# Patient Record
Sex: Female | Born: 1980 | Hispanic: Yes | Marital: Single | Smoking: Never smoker
Health system: Southern US, Community
[De-identification: ages and names within clinical notes are randomized; demographics above are authoritative.]

## PROBLEM LIST (undated history)

## (undated) HISTORY — PX: CHOLECYSTECTOMY: SHX55

---

## 2017-09-04 ENCOUNTER — Emergency Department (HOSPITAL_COMMUNITY): Admission: EM | Admit: 2017-09-04 | Discharge: 2017-09-05 | Discharge status: Against Medical Advice | Payer: Self-pay

## 2017-09-04 NOTE — ED Notes (Signed)
Called multiple times for triage with no answer 

## 2017-09-05 ENCOUNTER — Emergency Department (HOSPITAL_COMMUNITY)
Admission: EM | Admit: 2017-09-05 | Discharge: 2017-09-05 | Disposition: A | Discharge status: Home / Self Care | Payer: Self-pay | Attending: Emergency Medicine | Admitting: Emergency Medicine

## 2017-09-05 ENCOUNTER — Encounter (HOSPITAL_COMMUNITY): Payer: Self-pay | Admitting: Emergency Medicine

## 2017-09-05 ENCOUNTER — Emergency Department (HOSPITAL_COMMUNITY): Payer: Self-pay

## 2017-09-05 DIAGNOSIS — R200 Anesthesia of skin: Secondary | ICD-10-CM | POA: Insufficient documentation

## 2017-09-05 DIAGNOSIS — R51 Headache: Secondary | ICD-10-CM | POA: Insufficient documentation

## 2017-09-05 DIAGNOSIS — R519 Headache, unspecified: Secondary | ICD-10-CM

## 2017-09-05 DIAGNOSIS — R42 Dizziness and giddiness: Secondary | ICD-10-CM | POA: Insufficient documentation

## 2017-09-05 DIAGNOSIS — R202 Paresthesia of skin: Secondary | ICD-10-CM | POA: Insufficient documentation

## 2017-09-05 LAB — COMPREHENSIVE METABOLIC PANEL
ALT: 25 U/L (ref 14–54)
AST: 23 U/L (ref 15–41)
Albumin: 4.4 g/dL (ref 3.5–5.0)
Alkaline Phosphatase: 65 U/L (ref 38–126)
Anion gap: 12 (ref 5–15)
BUN: 8 mg/dL (ref 6–20)
CO2: 23 mmol/L (ref 22–32)
Calcium: 9.6 mg/dL (ref 8.9–10.3)
Chloride: 102 mmol/L (ref 101–111)
Creatinine, Ser: 0.69 mg/dL (ref 0.44–1.00)
GFR calc Af Amer: >60 mL/min (ref >60)
GFR calc non Af Amer: >60 mL/min (ref >60)
Glucose, Bld: 114 mg/dL — ABNORMAL HIGH (ref 65–99)
Potassium: 4.1 mmol/L (ref 3.5–5.1)
Sodium: 137 mmol/L (ref 135–145)
Total Bilirubin: 0.5 mg/dL (ref 0.3–1.2)
Total Protein: 7.6 g/dL (ref 6.5–8.1)

## 2017-09-05 LAB — CBC
HCT: 42.3 % (ref 36.0–46.0)
Hemoglobin: 14.1 g/dL (ref 12.0–15.0)
MCH: 29.3 pg (ref 26.0–34.0)
MCHC: 33.3 g/dL (ref 30.0–36.0)
MCV: 87.8 fL (ref 78.0–100.0)
Platelets: 363 10*3/uL (ref 150–400)
RBC: 4.82 MIL/uL (ref 3.87–5.11)
RDW: 13.6 % (ref 11.5–15.5)
WBC: 8.4 10*3/uL (ref 4.0–10.5)

## 2017-09-05 LAB — I-STAT CHEM 8, ED
BUN: 7 mg/dL (ref 6–20)
Calcium, Ion: 1.15 mmol/L (ref 1.15–1.40)
Chloride: 103 mmol/L (ref 101–111)
Creatinine, Ser: 0.7 mg/dL (ref 0.44–1.00)
Glucose, Bld: 114 mg/dL — ABNORMAL HIGH (ref 65–99)
HCT: 43 % (ref 36.0–46.0)
Hemoglobin: 14.6 g/dL (ref 12.0–15.0)
Potassium: 4 mmol/L (ref 3.5–5.1)
Sodium: 140 mmol/L (ref 135–145)
TCO2: 25 mmol/L (ref 22–32)

## 2017-09-05 LAB — DIFFERENTIAL
Basophils Absolute: 0 10*3/uL (ref 0.0–0.1)
Basophils Relative: 0 %
Eosinophils Absolute: 0.1 10*3/uL (ref 0.0–0.7)
Eosinophils Relative: 1 %
Lymphocytes Relative: 48 %
Lymphs Abs: 4.1 10*3/uL — ABNORMAL HIGH (ref 0.7–4.0)
Monocytes Absolute: 0.7 10*3/uL (ref 0.1–1.0)
Monocytes Relative: 9 %
Neutro Abs: 3.5 10*3/uL (ref 1.7–7.7)
Neutrophils Relative %: 42 %

## 2017-09-05 LAB — I-STAT TROPONIN, ED: Troponin i, poc: 0 ng/mL (ref 0.00–0.08)

## 2017-09-05 LAB — APTT: aPTT: 31 seconds (ref 24–36)

## 2017-09-05 LAB — PROTIME-INR
INR: 1.02
Prothrombin Time: 13.3 seconds (ref 11.4–15.2)

## 2017-09-05 LAB — I-STAT BETA HCG BLOOD, ED (MC, WL, AP ONLY): I-stat hCG, quantitative: <5 m[IU]/mL (ref <5)

## 2017-09-05 MED ORDER — GADOBENATE DIMEGLUMINE 529 MG/ML IV SOLN
15.0000 mL | Freq: Once | INTRAVENOUS | Status: AC | PRN
  Administered 2017-09-05: 15 mL via INTRAVENOUS

## 2017-09-05 MED ORDER — PROCHLORPERAZINE MALEATE 10 MG PO TABS: 10.0000 mg | ORAL_TABLET | Freq: Two times a day (BID) | ORAL | 0 refills | Status: AC | PRN

## 2017-09-05 NOTE — ED Notes (Signed)
Declined W/C at D/C and was escorted to lobby by RN. 

## 2017-09-05 NOTE — ED Triage Notes (Signed)
EDP at bed side to report results of MRI

## 2017-09-05 NOTE — ED Provider Notes (Signed)
[facility] EMERGENCY DEPARTMENT Provider Note   CSN: [id number] Arrival date & time: 09/05/17  0304     History   Chief Complaint Chief Complaint  Patient presents with  . Numbness    HPI Brandi Nicholson is a 37 y.o. female.  The history is provided by the patient and medical records. The history is limited by a language barrier. A language interpreter was used.  Neurologic Problem  This is a new problem. The current episode started more than 2 days ago. The problem occurs constantly. The problem has not changed since onset.Pertinent negatives include no chest pain, no abdominal pain, no headaches and no shortness of breath. Nothing aggravates the symptoms. Nothing relieves the symptoms. She has tried nothing for the symptoms. The treatment provided no relief.    History reviewed. No pertinent past medical history.  There are no active problems to display for this patient.   Past Surgical History:  Procedure Laterality Date  . CHOLECYSTECTOMY       OB History   None      Home Medications    Prior to Admission medications   Not on File    Family History No family history on file.  Social History Social History   Tobacco Use  . Smoking status: Not on file  . Smokeless tobacco: Never Used  Substance Use Topics  . Alcohol use: Yes    Comment: Socially   . Drug use: Never     Allergies   Patient has no known allergies.   Review of Systems Review of Systems  Constitutional: Negative for chills, diaphoresis, fatigue and fever.  HENT: Negative for congestion.   Eyes: Positive for visual disturbance.  Respiratory: Negative for cough, chest tightness, shortness of breath and stridor.   Cardiovascular: Negative for chest pain, palpitations and leg swelling.  Gastrointestinal: Negative for abdominal pain, constipation, diarrhea, nausea and vomiting.  Genitourinary: Negative for flank pain.  Musculoskeletal: Negative for back pain, neck  pain and neck stiffness.  Neurological: Positive for dizziness, weakness (subjective) and numbness. Negative for seizures, speech difficulty, light-headedness and headaches.  Psychiatric/Behavioral: Negative for agitation.  All other systems reviewed and are negative.    Physical Exam Updated Vital Signs BP 125/82 (BP Location: Left Arm)   Pulse (!) 53   Temp 98 F (36.7 C) (Oral)   Resp 16   Ht 5\' 3"  (1.6 m)   Wt 72.6 kg (160 lb)   SpO2 100%   BMI 28.34 kg/m   Physical Exam  Constitutional: She is oriented to person, place, and time. She appears well-developed and well-nourished. No distress.  HENT:  Head: Normocephalic and atraumatic.  Mouth/Throat: Oropharynx is clear and moist. No oropharyngeal exudate.  Eyes: Pupils are equal, round, and reactive to light. Conjunctivae and EOM are normal.  Neck: Normal range of motion. Neck supple.  Cardiovascular: Normal rate and intact distal pulses.  No murmur heard. Pulmonary/Chest: Effort normal and breath sounds normal. No respiratory distress. She has no wheezes. She exhibits no tenderness.  Abdominal: Bowel sounds are normal. There is no tenderness.  Musculoskeletal: She exhibits no edema or tenderness.  Neurological: She is alert and oriented to person, place, and time. She is not disoriented. She displays no tremor. A sensory deficit is present. No cranial nerve deficit. She exhibits normal muscle tone. Coordination normal.  Normal finger-nose-finger testing.  Patient felt dizzy and gait testing was deferred at this time.  No weakness appreciated however she has right-sided numbness in her  right arm and right sole of the foot.  Skin: Skin is warm. Capillary refill takes less than 2 seconds. No rash noted. She is not diaphoretic. No erythema.  Psychiatric: She has a normal mood and affect.  Nursing note and vitals reviewed.    ED Treatments / Results  Labs (all labs ordered are listed, but only abnormal results are  displayed) Labs Reviewed  DIFFERENTIAL - Abnormal; Notable for the following components:      Result Value   Lymphs Abs 4.1 (*)    All other components within normal limits  COMPREHENSIVE METABOLIC PANEL - Abnormal; Notable for the following components:   Glucose, Bld 114 (*)    All other components within normal limits  I-STAT CHEM 8, ED - Abnormal; Notable for the following components:   Glucose, Bld 114 (*)    All other components within normal limits  PROTIME-INR  APTT  CBC  I-STAT TROPONIN, ED  I-STAT BETA HCG BLOOD, ED ([facility], [facility], [facility] ONLY)    EKG None  Radiology Ct Head Wo Contrast  Result Date: 09/05/2017 CLINICAL DATA:  Right arm numbness and blurry vision for 4 days. Sensory deficits in the right arm. EXAM: CT HEAD WITHOUT CONTRAST TECHNIQUE: Contiguous axial images were obtained from the base of the skull through the vertex without intravenous contrast. COMPARISON:  None. FINDINGS: Brain: No evidence of acute infarction, hemorrhage, hydrocephalus, extra-axial collection or mass lesion/mass effect. Vascular: No hyperdense vessel or unexpected calcification. Skull: Normal. Negative for fracture or focal lesion. Sinuses/Orbits: No acute finding. Other: None. IMPRESSION: No acute intracranial abnormalities. Electronically Signed   By: Burman Nieves M.D.   On: 09/05/2017 04:44   Mr Angiogram Head Wo Contrast  Result Date: 09/05/2017 CLINICAL DATA:  37 year old female with blurred vision and right arm numbness onset 4 days ago. EXAM: MRI HEAD WITHOUT AND WITH CONTRAST MRI CERVICAL SPINE WITHOUT AND WITH CONTRAST MRA HEAD WITHOUT CONTRAST MRA NECK WITHOUT AND WITH CONTRAST TECHNIQUE: Multiplanar, multiecho pulse sequences of the brain and cervical spine were obtained without and with intravenous contrast. Angiographic images of the Circle of Willis were obtained using MRA technique without intravenous contrast. Angiographic images of the neck were obtained using MRA technique without  and with intravenous contrast. Carotid stenosis measurements (when applicable) are obtained utilizing NASCET criteria, using the distal internal carotid diameter as the denominator. CONTRAST:  15mL MULTIHANCE GADOBENATE DIMEGLUMINE 529 MG/ML IV SOLN COMPARISON:  Head CT without contrast 0341 hours today. FINDINGS: MRI HEAD FINDINGS Brain: Cerebral volume is within normal limits. No restricted diffusion to suggest acute infarction. No midline shift, mass effect, evidence of mass lesion, ventriculomegaly, extra-axial collection or acute intracranial hemorrhage. Cervicomedullary junction and pituitary are within normal limits. Wallace Cullens and white matter signal is within normal limits throughout the brain. No encephalomalacia or chronic cerebral blood products. No abnormal enhancement identified. No dural thickening. Vascular: Major intracranial vascular flow voids are preserved. The major dural venous sinuses are enhancing and appear to be patent. Skull and upper cervical spine: Cervical spine findings are below. Visualized bone marrow signal is within normal limits. Sinuses/Orbits: Normal orbits soft tissues. Paranasal sinuses are clear aside from mild right maxillary alveolar recess mucosal thickening. Other: Visible internal auditory structures appear normal. Mastoids are clear. Scalp and face soft tissues appear negative. MRA NECK FINDINGS Precontrast time-of-flight images demonstrate antegrade flow in both cervical carotid and vertebral arteries to the skull base. The left vertebral artery is mildly dominant. The carotid bifurcations appear normal. Suboptimal arterial contrast bolus  timing in the neck. However, adequate arterial detail is present on the MRA source images. Three vessel arch configuration. Normal great vessel origins. The right CCA, right carotid bifurcation, and cervical right ICA appear normal. The left CCA, left carotid bifurcation, and cervical left ICA appear normal. Both proximal subclavian  arteries appear normal. Both vertebral artery origins are normal. The left vertebral artery is dominant. No vertebral artery stenosis or abnormality is evident in the neck. MRA HEAD FINDINGS Antegrade flow in the posterior circulation with mildly dominant distal left vertebral artery. Normal left PICA origin. The right AICA appears dominant and is patent. Patent vertebrobasilar junction and basilar artery without stenosis. Normal SCA and right PCA origins. Fetal type left PCA origin. The right posterior communicating artery is present. Bilateral PCA branches appear normal. Antegrade flow in both ICA siphons. No siphon stenosis. Normal ophthalmic and posterior communicating artery origins. Patent carotid termini. Normal MCA and ACA origins. The anterior communicating artery is within normal limits. Visible bilateral ACA branches are within normal limits. The MCA M1 segments appear symmetric and within normal limits. The MCA bi/trifurcations and visible bilateral MCA branches appear symmetric and within normal limits. MRI CERVICAL SPINE FINDINGS Alignment: Mild straightening of cervical lordosis. No spondylolisthesis. Vertebrae: No marrow edema or evidence of acute osseous abnormality. Visualized bone marrow signal is within normal limits. Cord: Spinal cord signal is within normal limits at all visualized levels. No abnormal intradural enhancement. No dural thickening. Paraspinal and other soft tissues: Negative paraspinal and visible neck soft tissues. The major vascular flow voids in the neck are preserved. Negative visible lung apices and superior mediastinum. Disc levels: C2-C3:  Negative. C3-C4: Mild broad-based posterior disc bulge or protrusion with mild endplate spurring (series 15, image 7). Narrowing of the ventral CSF space. Borderline to mild spinal stenosis. No spinal cord mass effect or signal abnormality. No convincing foraminal involvement. C4-C5:  Negative. C5-C6: Small right paracentral disc  protrusion (series 21, image 16). No stenosis. C6-C7: Subtle central disc protrusion or annular fissure (series 21, image 21). No stenosis. C7-T1:  Negative. The visible upper thoracic levels appear negative. IMPRESSION: 1.  Normal MRI appearance of the brain. 2. No arterial abnormality on MRA Head and Neck. 3. Normal cervical and visible upper thoracic spinal cord. 4. Mild cervical disc degeneration at C3-C4, C5-C6, and C6-C7. There is subsequent minimal to mild spinal stenosis at C3-C4, but no other convincing neural impingement. Electronically Signed   By: Odessa Fleming M.D.   On: 09/05/2017 15:17   Mr Angiogram Neck W Or Wo Contrast  Result Date: 09/05/2017 CLINICAL DATA:  37 year old female with blurred vision and right arm numbness onset 4 days ago. EXAM: MRI HEAD WITHOUT AND WITH CONTRAST MRI CERVICAL SPINE WITHOUT AND WITH CONTRAST MRA HEAD WITHOUT CONTRAST MRA NECK WITHOUT AND WITH CONTRAST TECHNIQUE: Multiplanar, multiecho pulse sequences of the brain and cervical spine were obtained without and with intravenous contrast. Angiographic images of the Circle of Willis were obtained using MRA technique without intravenous contrast. Angiographic images of the neck were obtained using MRA technique without and with intravenous contrast. Carotid stenosis measurements (when applicable) are obtained utilizing NASCET criteria, using the distal internal carotid diameter as the denominator. CONTRAST:  15mL MULTIHANCE GADOBENATE DIMEGLUMINE 529 MG/ML IV SOLN COMPARISON:  Head CT without contrast 0341 hours today. FINDINGS: MRI HEAD FINDINGS Brain: Cerebral volume is within normal limits. No restricted diffusion to suggest acute infarction. No midline shift, mass effect, evidence of mass lesion, ventriculomegaly, extra-axial collection or acute  intracranial hemorrhage. Cervicomedullary junction and pituitary are within normal limits. Wallace Cullens and white matter signal is within normal limits throughout the brain. No  encephalomalacia or chronic cerebral blood products. No abnormal enhancement identified. No dural thickening. Vascular: Major intracranial vascular flow voids are preserved. The major dural venous sinuses are enhancing and appear to be patent. Skull and upper cervical spine: Cervical spine findings are below. Visualized bone marrow signal is within normal limits. Sinuses/Orbits: Normal orbits soft tissues. Paranasal sinuses are clear aside from mild right maxillary alveolar recess mucosal thickening. Other: Visible internal auditory structures appear normal. Mastoids are clear. Scalp and face soft tissues appear negative. MRA NECK FINDINGS Precontrast time-of-flight images demonstrate antegrade flow in both cervical carotid and vertebral arteries to the skull base. The left vertebral artery is mildly dominant. The carotid bifurcations appear normal. Suboptimal arterial contrast bolus timing in the neck. However, adequate arterial detail is present on the MRA source images. Three vessel arch configuration. Normal great vessel origins. The right CCA, right carotid bifurcation, and cervical right ICA appear normal. The left CCA, left carotid bifurcation, and cervical left ICA appear normal. Both proximal subclavian arteries appear normal. Both vertebral artery origins are normal. The left vertebral artery is dominant. No vertebral artery stenosis or abnormality is evident in the neck. MRA HEAD FINDINGS Antegrade flow in the posterior circulation with mildly dominant distal left vertebral artery. Normal left PICA origin. The right AICA appears dominant and is patent. Patent vertebrobasilar junction and basilar artery without stenosis. Normal SCA and right PCA origins. Fetal type left PCA origin. The right posterior communicating artery is present. Bilateral PCA branches appear normal. Antegrade flow in both ICA siphons. No siphon stenosis. Normal ophthalmic and posterior communicating artery origins. Patent carotid  termini. Normal MCA and ACA origins. The anterior communicating artery is within normal limits. Visible bilateral ACA branches are within normal limits. The MCA M1 segments appear symmetric and within normal limits. The MCA bi/trifurcations and visible bilateral MCA branches appear symmetric and within normal limits. MRI CERVICAL SPINE FINDINGS Alignment: Mild straightening of cervical lordosis. No spondylolisthesis. Vertebrae: No marrow edema or evidence of acute osseous abnormality. Visualized bone marrow signal is within normal limits. Cord: Spinal cord signal is within normal limits at all visualized levels. No abnormal intradural enhancement. No dural thickening. Paraspinal and other soft tissues: Negative paraspinal and visible neck soft tissues. The major vascular flow voids in the neck are preserved. Negative visible lung apices and superior mediastinum. Disc levels: C2-C3:  Negative. C3-C4: Mild broad-based posterior disc bulge or protrusion with mild endplate spurring (series 15, image 7). Narrowing of the ventral CSF space. Borderline to mild spinal stenosis. No spinal cord mass effect or signal abnormality. No convincing foraminal involvement. C4-C5:  Negative. C5-C6: Small right paracentral disc protrusion (series 21, image 16). No stenosis. C6-C7: Subtle central disc protrusion or annular fissure (series 21, image 21). No stenosis. C7-T1:  Negative. The visible upper thoracic levels appear negative. IMPRESSION: 1.  Normal MRI appearance of the brain. 2. No arterial abnormality on MRA Head and Neck. 3. Normal cervical and visible upper thoracic spinal cord. 4. Mild cervical disc degeneration at C3-C4, C5-C6, and C6-C7. There is subsequent minimal to mild spinal stenosis at C3-C4, but no other convincing neural impingement. Electronically Signed   By: Odessa Fleming M.D.   On: 09/05/2017 15:17   Mr Laqueta Jean And Wo Contrast  Result Date: 09/05/2017 CLINICAL DATA:  37 year old female with blurred vision and  right arm numbness onset  4 days ago. EXAM: MRI HEAD WITHOUT AND WITH CONTRAST MRI CERVICAL SPINE WITHOUT AND WITH CONTRAST MRA HEAD WITHOUT CONTRAST MRA NECK WITHOUT AND WITH CONTRAST TECHNIQUE: Multiplanar, multiecho pulse sequences of the brain and cervical spine were obtained without and with intravenous contrast. Angiographic images of the Circle of Willis were obtained using MRA technique without intravenous contrast. Angiographic images of the neck were obtained using MRA technique without and with intravenous contrast. Carotid stenosis measurements (when applicable) are obtained utilizing NASCET criteria, using the distal internal carotid diameter as the denominator. CONTRAST:  15mL MULTIHANCE GADOBENATE DIMEGLUMINE 529 MG/ML IV SOLN COMPARISON:  Head CT without contrast 0341 hours today. FINDINGS: MRI HEAD FINDINGS Brain: Cerebral volume is within normal limits. No restricted diffusion to suggest acute infarction. No midline shift, mass effect, evidence of mass lesion, ventriculomegaly, extra-axial collection or acute intracranial hemorrhage. Cervicomedullary junction and pituitary are within normal limits. Wallace Cullens and white matter signal is within normal limits throughout the brain. No encephalomalacia or chronic cerebral blood products. No abnormal enhancement identified. No dural thickening. Vascular: Major intracranial vascular flow voids are preserved. The major dural venous sinuses are enhancing and appear to be patent. Skull and upper cervical spine: Cervical spine findings are below. Visualized bone marrow signal is within normal limits. Sinuses/Orbits: Normal orbits soft tissues. Paranasal sinuses are clear aside from mild right maxillary alveolar recess mucosal thickening. Other: Visible internal auditory structures appear normal. Mastoids are clear. Scalp and face soft tissues appear negative. MRA NECK FINDINGS Precontrast time-of-flight images demonstrate antegrade flow in both cervical carotid and  vertebral arteries to the skull base. The left vertebral artery is mildly dominant. The carotid bifurcations appear normal. Suboptimal arterial contrast bolus timing in the neck. However, adequate arterial detail is present on the MRA source images. Three vessel arch configuration. Normal great vessel origins. The right CCA, right carotid bifurcation, and cervical right ICA appear normal. The left CCA, left carotid bifurcation, and cervical left ICA appear normal. Both proximal subclavian arteries appear normal. Both vertebral artery origins are normal. The left vertebral artery is dominant. No vertebral artery stenosis or abnormality is evident in the neck. MRA HEAD FINDINGS Antegrade flow in the posterior circulation with mildly dominant distal left vertebral artery. Normal left PICA origin. The right AICA appears dominant and is patent. Patent vertebrobasilar junction and basilar artery without stenosis. Normal SCA and right PCA origins. Fetal type left PCA origin. The right posterior communicating artery is present. Bilateral PCA branches appear normal. Antegrade flow in both ICA siphons. No siphon stenosis. Normal ophthalmic and posterior communicating artery origins. Patent carotid termini. Normal MCA and ACA origins. The anterior communicating artery is within normal limits. Visible bilateral ACA branches are within normal limits. The MCA M1 segments appear symmetric and within normal limits. The MCA bi/trifurcations and visible bilateral MCA branches appear symmetric and within normal limits. MRI CERVICAL SPINE FINDINGS Alignment: Mild straightening of cervical lordosis. No spondylolisthesis. Vertebrae: No marrow edema or evidence of acute osseous abnormality. Visualized bone marrow signal is within normal limits. Cord: Spinal cord signal is within normal limits at all visualized levels. No abnormal intradural enhancement. No dural thickening. Paraspinal and other soft tissues: Negative paraspinal and  visible neck soft tissues. The major vascular flow voids in the neck are preserved. Negative visible lung apices and superior mediastinum. Disc levels: C2-C3:  Negative. C3-C4: Mild broad-based posterior disc bulge or protrusion with mild endplate spurring (series 15, image 7). Narrowing of the ventral CSF space. Borderline to mild spinal  stenosis. No spinal cord mass effect or signal abnormality. No convincing foraminal involvement. C4-C5:  Negative. C5-C6: Small right paracentral disc protrusion (series 21, image 16). No stenosis. C6-C7: Subtle central disc protrusion or annular fissure (series 21, image 21). No stenosis. C7-T1:  Negative. The visible upper thoracic levels appear negative. IMPRESSION: 1.  Normal MRI appearance of the brain. 2. No arterial abnormality on MRA Head and Neck. 3. Normal cervical and visible upper thoracic spinal cord. 4. Mild cervical disc degeneration at C3-C4, C5-C6, and C6-C7. There is subsequent minimal to mild spinal stenosis at C3-C4, but no other convincing neural impingement. Electronically Signed   By: Odessa FlemingH  Hall M.D.   On: 09/05/2017 15:17   Mr Cervical Spine W Or Wo Contrast  Result Date: 09/05/2017 CLINICAL DATA:  37 year old female with blurred vision and right arm numbness onset 4 days ago. EXAM: MRI HEAD WITHOUT AND WITH CONTRAST MRI CERVICAL SPINE WITHOUT AND WITH CONTRAST MRA HEAD WITHOUT CONTRAST MRA NECK WITHOUT AND WITH CONTRAST TECHNIQUE: Multiplanar, multiecho pulse sequences of the brain and cervical spine were obtained without and with intravenous contrast. Angiographic images of the Circle of Willis were obtained using MRA technique without intravenous contrast. Angiographic images of the neck were obtained using MRA technique without and with intravenous contrast. Carotid stenosis measurements (when applicable) are obtained utilizing NASCET criteria, using the distal internal carotid diameter as the denominator. CONTRAST:  15mL MULTIHANCE GADOBENATE  DIMEGLUMINE 529 MG/ML IV SOLN COMPARISON:  Head CT without contrast 0341 hours today. FINDINGS: MRI HEAD FINDINGS Brain: Cerebral volume is within normal limits. No restricted diffusion to suggest acute infarction. No midline shift, mass effect, evidence of mass lesion, ventriculomegaly, extra-axial collection or acute intracranial hemorrhage. Cervicomedullary junction and pituitary are within normal limits. Wallace CullensGray and white matter signal is within normal limits throughout the brain. No encephalomalacia or chronic cerebral blood products. No abnormal enhancement identified. No dural thickening. Vascular: Major intracranial vascular flow voids are preserved. The major dural venous sinuses are enhancing and appear to be patent. Skull and upper cervical spine: Cervical spine findings are below. Visualized bone marrow signal is within normal limits. Sinuses/Orbits: Normal orbits soft tissues. Paranasal sinuses are clear aside from mild right maxillary alveolar recess mucosal thickening. Other: Visible internal auditory structures appear normal. Mastoids are clear. Scalp and face soft tissues appear negative. MRA NECK FINDINGS Precontrast time-of-flight images demonstrate antegrade flow in both cervical carotid and vertebral arteries to the skull base. The left vertebral artery is mildly dominant. The carotid bifurcations appear normal. Suboptimal arterial contrast bolus timing in the neck. However, adequate arterial detail is present on the MRA source images. Three vessel arch configuration. Normal great vessel origins. The right CCA, right carotid bifurcation, and cervical right ICA appear normal. The left CCA, left carotid bifurcation, and cervical left ICA appear normal. Both proximal subclavian arteries appear normal. Both vertebral artery origins are normal. The left vertebral artery is dominant. No vertebral artery stenosis or abnormality is evident in the neck. MRA HEAD FINDINGS Antegrade flow in the posterior  circulation with mildly dominant distal left vertebral artery. Normal left PICA origin. The right AICA appears dominant and is patent. Patent vertebrobasilar junction and basilar artery without stenosis. Normal SCA and right PCA origins. Fetal type left PCA origin. The right posterior communicating artery is present. Bilateral PCA branches appear normal. Antegrade flow in both ICA siphons. No siphon stenosis. Normal ophthalmic and posterior communicating artery origins. Patent carotid termini. Normal MCA and ACA origins. The anterior communicating artery  is within normal limits. Visible bilateral ACA branches are within normal limits. The MCA M1 segments appear symmetric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

## 2017-09-05 NOTE — Discharge Instructions (Signed)
Your workup today did not show evidence of stroke.  We suspect your numbness and vision changes are secondary to your headaches.  Please use the medicine to help with your headache and please follow-up with neurology for further evaluation and management.  If any symptoms change or worsen, please return to the nearest emergency department.

## 2017-09-05 NOTE — ED Triage Notes (Signed)
Pt reports R arm numbness and blurry vision onset 4 days ago. Pt noted to have sensory deficits in R arm, no other neuro deficits. VAN negative.

## 2017-09-05 NOTE — ED Notes (Signed)
Patient continue to be in MRI

## 2017-09-05 NOTE — ED Notes (Signed)
Patient still in MRI.  

## 2017-09-05 NOTE — ED Notes (Signed)
Patient transported to MRI 

## 2017-10-15 ENCOUNTER — Emergency Department (HOSPITAL_COMMUNITY)
Admission: EM | Admit: 2017-10-15 | Discharge: 2017-10-15 | Disposition: A | Discharge status: Home / Self Care | Payer: Self-pay | Attending: Emergency Medicine | Admitting: Emergency Medicine

## 2017-10-15 ENCOUNTER — Encounter (HOSPITAL_COMMUNITY): Payer: Self-pay | Admitting: Emergency Medicine

## 2017-10-15 ENCOUNTER — Other Ambulatory Visit: Payer: Self-pay

## 2017-10-15 DIAGNOSIS — N390 Urinary tract infection, site not specified: Secondary | ICD-10-CM | POA: Insufficient documentation

## 2017-10-15 DIAGNOSIS — M5431 Sciatica, right side: Secondary | ICD-10-CM | POA: Insufficient documentation

## 2017-10-15 LAB — URINALYSIS, ROUTINE W REFLEX MICROSCOPIC
Bilirubin Urine: NEGATIVE
Bilirubin Urine: NEGATIVE
Glucose, UA: NEGATIVE mg/dL
Glucose, UA: NEGATIVE mg/dL
Hgb urine dipstick: NEGATIVE
Hgb urine dipstick: NEGATIVE
Ketones, ur: NEGATIVE mg/dL
Ketones, ur: NEGATIVE mg/dL
Nitrite: NEGATIVE
Nitrite: NEGATIVE
Protein, ur: NEGATIVE mg/dL
Protein, ur: NEGATIVE mg/dL
Specific Gravity, Urine: 1.021 (ref 1.005–1.030)
Specific Gravity, Urine: 1.021 (ref 1.005–1.030)
pH: 7 (ref 5.0–8.0)
pH: 7 (ref 5.0–8.0)

## 2017-10-15 LAB — URINALYSIS, MICROSCOPIC (REFLEX)

## 2017-10-15 LAB — POC URINE PREG, ED: Preg Test, Ur: NEGATIVE

## 2017-10-15 MED ORDER — PREDNISONE 10 MG (21) PO TBPK: ORAL_TABLET | Freq: Every day | ORAL | 0 refills | Status: AC

## 2017-10-15 MED ORDER — CEPHALEXIN 500 MG PO CAPS: 500.0000 mg | ORAL_CAPSULE | Freq: Two times a day (BID) | ORAL | 0 refills | Status: AC

## 2017-10-15 MED ORDER — ACETAMINOPHEN 325 MG PO TABS: 650.0000 mg | ORAL_TABLET | Freq: Four times a day (QID) | ORAL | 0 refills | Status: AC | PRN

## 2017-10-15 MED ORDER — IBUPROFEN 400 MG PO TABS: 400.0000 mg | ORAL_TABLET | Freq: Four times a day (QID) | ORAL | 0 refills | Status: AC | PRN

## 2017-10-15 NOTE — ED Notes (Signed)
See provider assessment 

## 2017-10-15 NOTE — ED Triage Notes (Signed)
Onset 2 weeks ago pain right hip radiating to right leg and lower back. Concerned pain right flank and has a burning sensation while urinating.

## 2017-10-15 NOTE — Discharge Instructions (Signed)
Recibi una receta de antibiticos para tratar la infeccin del tracto urinario. Por favor, tome la prescripcin de antibiticos por completo. Tambin se le da una receta para una reduccin de esteroides para ayudar a mejorar su dolor de espalda.  Puede alternar tomar Tylenol e Ibuprofeno segn sea necesario para Human resources officer. Puede tomar 400-600 mg de ibuprofeno cada 6 horas y [phone] mg de Tylenol cada 6 horas. No exceda los 4000 mg de Tylenol al da, ya que esto puede causar dao heptico. Adems, asegrese de tomar ibuprofeno con las comidas, ya que puede causar Programme researcher, broadcasting/film/video. No tome otros AINE mientras est tomando ibuprofeno como (Aleve, Naprosyn, Aspirin, Celebrex, etc.) y no tome ms de la dosis prescrita, ya que puede provocar lceras y sangrado en el tracto GI. Puede usar compresas fras y calientes para ayudar con sus sntomas.  Por favor haga un seguimiento con su mdico de cabecera dentro de los prximos 5 a 7 das. Si no tiene un proveedor de [employer], se le proporcion informacin para una clnica de atencin mdica para que pueda hacer arreglos para la atencin de seguimiento. Regrese a la sala de emergencias antes si tiene sntomas nuevos o que empeoran, o si tiene alguno de los siguientes sntomas:   Dolor abdominal que no desaparece.  Tienes fiebre.  Sigues vomitando (vomitando).  El dolor se siente solo en porciones del abdomen. El dolor en el lado derecho podra ser una apendicitis. En un adulto, el dolor en la porcin inferior izquierda del abdomen puede ser colitis o diverticulitis.  Pasas heces fecales sangrientas o negras. Hay sangre roja brillante en las heces. El estreimiento se mantiene durante ms de [address] Hay dolor abdominal (abdominal) o rectal. No pareces estar mejorando. Tiene alguna pregunta o inquietud.

## 2017-10-15 NOTE — ED Provider Notes (Signed)
[facility] EMERGENCY DEPARTMENT Provider Note   CSN: [id number] Arrival date & time: 10/15/17  1253     History   Chief Complaint Chief Complaint  Patient presents with  . Hip Pain   Patient is Spanish speaking and interpreter was used throughout the evaluation.  HPI Brandi Nicholson is a 37 y.o. female.  HPI   Patient is a 37 year old female who presents the ED today to be evaluated for constant right buttock pain that has been ongoing for the last 2 weeks.  Pain radiates down the posterior aspect of her right leg.  Feels like a stabbing pain it it is severe in nature.  It is worse with walking.  She has tried Tylenol ibuprofen, warm compresses, cold compresses with no relief.  Denies any numbness or weakness to the legs.  Denies loss of bowel or bladder control.  No saddle anesthesia.  No history of cancer or IV drug use.  No fevers.  She does report some associated dysuria, but denies any vaginal bleeding vaginal discharge.  She states she has no concern for STDs and she is in a monogamous relationship at this time.  Denies any abdominal pain.  Has had some intermittent nausea and vomiting for the last 2 weeks.  States she vomited one time last night when the pain in her buttock was severe.  She has had no further episodes of vomiting today.  No hematemesis.  No other symptoms.  History reviewed. No pertinent past medical history.  There are no active problems to display for this patient.   Past Surgical History:  Procedure Laterality Date  . CHOLECYSTECTOMY       OB History   None      Home Medications    Prior to Admission medications   Medication Sig Start Date End Date Taking? Authorizing Provider  acetaminophen (TYLENOL) 325 MG tablet Take 2 tablets (650 mg total) by mouth every 6 (six) hours as needed. Do not take more than  of tylenol per day 10/15/17   Aradhana Gin S, PA-C  cephALEXin (KEFLEX) 500 MG capsule Take 1 capsule  (500 mg total) by mouth 2 (two) times daily for 7 days. 10/15/17 10/22/17  Kelissa Merlin S, PA-C  ibuprofen (ADVIL,MOTRIN) 400 MG tablet Take 1 tablet (400 mg total) by mouth every 6 (six) hours as needed. 10/15/17   Keyvon Herter S, PA-C  predniSONE (STERAPRED UNI-PAK 21 TAB) 10 MG (21) TBPK tablet Take by mouth daily. Take 6 tabs by mouth daily  for 2 days, then 5 tabs for 2 days, then 4 tabs for 2 days, then 3 tabs for 2 days, 2 tabs for 2 days, then 1 tab by mouth daily for 2 days 10/15/17   Asma Boldon S, PA-C  prochlorperazine (COMPAZINE) 10 MG tablet Take 1 tablet (10 mg total) by mouth 2 (two) times daily as needed for nausea or vomiting (headache). 09/05/17   Tegeler, Canary Brim, MD    Family History No family history on file.  Social History Social History   Tobacco Use  . Smoking status: Not on file  . Smokeless tobacco: Never Used  Substance Use Topics  . Alcohol use: Yes    Comment: Socially   . Drug use: Never     Allergies   Patient has no known allergies.   Review of Systems Review of Systems  Constitutional: Negative for fever.  Eyes: Negative for visual disturbance.  Respiratory: Negative for shortness of breath.  Cardiovascular: Negative for chest pain.  Gastrointestinal: Positive for nausea and vomiting. Negative for abdominal pain, blood in stool, constipation and diarrhea.  Genitourinary: Positive for dysuria. Negative for frequency, hematuria, pelvic pain, urgency, vaginal bleeding and vaginal discharge.  Musculoskeletal: Positive for back pain. Negative for gait problem.  Skin: Negative for wound.  Neurological: Negative for headaches.     Physical Exam Updated Vital Signs BP 129/85   Pulse 70   Temp 98.3 F (36.8 C) (Oral)   Resp 18   Ht  (1.6 m)   Wt 74.8 kg (165 lb)   SpO2 100%   BMI 29.23 kg/m   Physical Exam  Constitutional: She appears well-developed and well-nourished. No distress.  HENT:  Head: Normocephalic and  atraumatic.  Eyes: Conjunctivae are normal.  Neck: Neck supple.  Cardiovascular: Normal rate and regular rhythm.  No murmur heard. Pulmonary/Chest: Effort normal and breath sounds normal. No respiratory distress. She has no wheezes.  Abdominal: Soft. Bowel sounds are normal. She exhibits no distension. There is no tenderness. There is no guarding.  No CVA tenderness bilaterally  Musculoskeletal: She exhibits no edema.  Patient has midline lumbar tenderness, lumbar paraspinous tenderness, as well as tenderness along the right iliac crest, right sciatic notch, and diffusely throughout the right buttock which reproduces her pain.  Neurological: She is alert.  Motor:  Normal tone. 5/5 strength with hip flexion, knee extension/flexion, dorsiflexion/plantarflexion. Sensory: light touch normal in all extremities. Gait: normal gait and balance.  Has slight limp. CV: 2+ radial and DP/PT pulses   Skin: Skin is warm and dry.  Psychiatric: She has a normal mood and affect.  Nursing note and vitals reviewed.   ED Treatments / Results  Labs (all labs ordered are listed, but only abnormal results are displayed) Labs Reviewed  URINALYSIS, ROUTINE W REFLEX MICROSCOPIC - Abnormal; Notable for the following components:      Result Value   APPearance CLOUDY (*)    Leukocytes, UA LARGE (*)    Bacteria, UA RARE (*)    All other components within normal limits  URINALYSIS, ROUTINE W REFLEX MICROSCOPIC - Abnormal; Notable for the following components:   APPearance CLOUDY (*)    Leukocytes, UA LARGE (*)    All other components within normal limits  URINALYSIS, MICROSCOPIC (REFLEX) - Abnormal; Notable for the following components:   Bacteria, UA RARE (*)    All other components within normal limits  URINE CULTURE  POC URINE PREG, ED    EKG None  Radiology No results found.  Procedures Procedures (including critical care time)  Medications Ordered in ED Medications - No data to  display   Initial Impression / Assessment and Plan / ED Course  I have reviewed the triage vital signs and the nursing notes.  Pertinent labs & imaging results that were available during my care of the patient were reviewed by me and considered in my medical decision making (see chart for details).   Final Clinical Impressions(s) / ED Diagnoses   Final diagnoses:  Sciatica of right side  Urinary tract infection without hematuria, site unspecified   Patient presenting with right lower back and right buttock pain for 2 weeks.  Consistent with sciatica.  Her vital signs are stable and she is afebrile in the ED.  She is nontoxic-appearing and is no acute distress in the ED. normal neurological exam, no evidence of urinary incontinence or retention, pain is consistently reproducible. There is no evidence of AAA or concern for dissection  at this time.   Patient can walk but states is painful.  No loss of bowel or bladder control.  No concern for cauda equina.  No fever, night sweats, weight loss, h/o cancer, IVDU.    Pt has also been diagnosed with a UTI as well. Pt is afebrile, no CVA tenderness, normotensive, and has had no episodes of vomiting in the last 24 hours. Do not suspect pyelo. She denies concern for STDs.  She is nontoxic-appearing.  Urine was sent for culture.  Pt to be dc home with antibiotics and instructions to follow up with PCP if symptoms persist.   RICE protocol and pain medicine indicated and discussed with patient. I have also discussed reasons to return immediately to the ER.  Patient expresses understanding and agrees with plan.   ED Discharge Orders        Ordered    cephALEXin (KEFLEX) 500 MG capsule  2 times daily     10/15/17 1508    predniSONE (STERAPRED UNI-PAK 21 TAB) 10 MG (21) TBPK tablet  Daily     10/15/17 1508    acetaminophen (TYLENOL) 325 MG tablet  Every 6 hours PRN     10/15/17 1508    ibuprofen (ADVIL,MOTRIN) 400 MG tablet  Every 6 hours PRN      10/15/17 1508       Danyel Griess S, PA-C 10/15/17 1511    Benjiman Core, MD 10/15/17 1615

## 2017-10-15 NOTE — ED Notes (Signed)
WBC on urine 0-5.

## 2017-10-16 LAB — URINE CULTURE

## 2020-02-11 ENCOUNTER — Emergency Department (HOSPITAL_COMMUNITY): Payer: Self-pay

## 2020-02-11 ENCOUNTER — Emergency Department (HOSPITAL_COMMUNITY)
Admission: EM | Admit: 2020-02-11 | Discharge: 2020-02-11 | Disposition: A | Discharge status: Home / Self Care | Payer: Self-pay | Attending: Emergency Medicine | Admitting: Emergency Medicine

## 2020-02-11 ENCOUNTER — Encounter (HOSPITAL_COMMUNITY): Payer: Self-pay | Admitting: Emergency Medicine

## 2020-02-11 ENCOUNTER — Other Ambulatory Visit: Payer: Self-pay

## 2020-02-11 DIAGNOSIS — M542 Cervicalgia: Secondary | ICD-10-CM | POA: Insufficient documentation

## 2020-02-11 DIAGNOSIS — Y92410 Unspecified street and highway as the place of occurrence of the external cause: Secondary | ICD-10-CM | POA: Diagnosis not present

## 2020-02-11 DIAGNOSIS — R079 Chest pain, unspecified: Secondary | ICD-10-CM | POA: Diagnosis not present

## 2020-02-11 DIAGNOSIS — Z79899 Other long term (current) drug therapy: Secondary | ICD-10-CM | POA: Insufficient documentation

## 2020-02-11 DIAGNOSIS — R519 Headache, unspecified: Secondary | ICD-10-CM | POA: Diagnosis not present

## 2020-02-11 DIAGNOSIS — T1490XA Injury, unspecified, initial encounter: Secondary | ICD-10-CM

## 2020-02-11 DIAGNOSIS — R52 Pain, unspecified: Secondary | ICD-10-CM

## 2020-02-11 DIAGNOSIS — S53105A Unspecified dislocation of left ulnohumeral joint, initial encounter: Secondary | ICD-10-CM | POA: Insufficient documentation

## 2020-02-11 DIAGNOSIS — Y999 Unspecified external cause status: Secondary | ICD-10-CM | POA: Diagnosis not present

## 2020-02-11 DIAGNOSIS — Y939 Activity, unspecified: Secondary | ICD-10-CM | POA: Insufficient documentation

## 2020-02-11 DIAGNOSIS — S59902A Unspecified injury of left elbow, initial encounter: Secondary | ICD-10-CM | POA: Diagnosis present

## 2020-02-11 DIAGNOSIS — Z20822 Contact with and (suspected) exposure to covid-19: Secondary | ICD-10-CM | POA: Insufficient documentation

## 2020-02-11 LAB — I-STAT CHEM 8, ED
BUN: 19 mg/dL (ref 6–20)
Calcium, Ion: 1.17 mmol/L (ref 1.15–1.40)
Chloride: 106 mmol/L (ref 98–111)
Creatinine, Ser: 0.7 mg/dL (ref 0.44–1.00)
Glucose, Bld: 129 mg/dL — ABNORMAL HIGH (ref 70–99)
HCT: 42 % (ref 36.0–46.0)
Hemoglobin: 14.3 g/dL (ref 12.0–15.0)
Potassium: 4 mmol/L (ref 3.5–5.1)
Sodium: 140 mmol/L (ref 135–145)
TCO2: 23 mmol/L (ref 22–32)

## 2020-02-11 LAB — COMPREHENSIVE METABOLIC PANEL
ALT: 35 U/L (ref 0–44)
AST: 25 U/L (ref 15–41)
Albumin: 4.9 g/dL (ref 3.5–5.0)
Alkaline Phosphatase: 56 U/L (ref 38–126)
Anion gap: 14 (ref 5–15)
BUN: 20 mg/dL (ref 6–20)
CO2: 22 mmol/L (ref 22–32)
Calcium: 9.4 mg/dL (ref 8.9–10.3)
Chloride: 103 mmol/L (ref 98–111)
Creatinine, Ser: 0.72 mg/dL (ref 0.44–1.00)
GFR calc Af Amer: >60 mL/min (ref >60)
GFR calc non Af Amer: >60 mL/min (ref >60)
Glucose, Bld: 130 mg/dL — ABNORMAL HIGH (ref 70–99)
Potassium: 3.8 mmol/L (ref 3.5–5.1)
Sodium: 139 mmol/L (ref 135–145)
Total Bilirubin: 0.4 mg/dL (ref 0.3–1.2)
Total Protein: 8.4 g/dL — ABNORMAL HIGH (ref 6.5–8.1)

## 2020-02-11 LAB — SARS CORONAVIRUS 2 BY RT PCR (HOSPITAL ORDER, PERFORMED IN ~~LOC~~ HOSPITAL LAB): SARS Coronavirus 2: NEGATIVE

## 2020-02-11 LAB — CBC
HCT: 41.9 % (ref 36.0–46.0)
Hemoglobin: 14.1 g/dL (ref 12.0–15.0)
MCH: 29.8 pg (ref 26.0–34.0)
MCHC: 33.7 g/dL (ref 30.0–36.0)
MCV: 88.6 fL (ref 80.0–100.0)
Platelets: 371 10*3/uL (ref 150–400)
RBC: 4.73 MIL/uL (ref 3.87–5.11)
RDW: 13.2 % (ref 11.5–15.5)
WBC: 19.5 10*3/uL — ABNORMAL HIGH (ref 4.0–10.5)
nRBC: 0 % (ref 0.0–0.2)

## 2020-02-11 LAB — I-STAT BETA HCG BLOOD, ED (MC, WL, AP ONLY): I-stat hCG, quantitative: <5 m[IU]/mL (ref <5)

## 2020-02-11 LAB — SAMPLE TO BLOOD BANK

## 2020-02-11 MED ORDER — SODIUM CHLORIDE 0.9% FLUSH
3.0000 mL | INTRAVENOUS | Status: DC | PRN
Start: 2020-02-11 — End: 2020-02-12

## 2020-02-11 MED ORDER — PROPOFOL 10 MG/ML IV BOLUS
0.5000 mg/kg | INTRAVENOUS | Status: DC | PRN
  Administered 2020-02-11 (×4): 39.3 mg via INTRAVENOUS
  Filled 2020-02-11: qty 20

## 2020-02-11 MED ORDER — HYDROMORPHONE HCL 1 MG/ML IJ SOLN
1.0000 mg | Freq: Once | INTRAMUSCULAR | Status: AC
  Administered 2020-02-11: 1 mg via INTRAVENOUS
  Filled 2020-02-11: qty 1

## 2020-02-11 MED ORDER — HYDROMORPHONE HCL 1 MG/ML IJ SOLN
1.0000 mg | INTRAMUSCULAR | Status: AC | PRN
  Administered 2020-02-11 (×2): 1 mg via INTRAVENOUS
  Filled 2020-02-11 (×2): qty 1

## 2020-02-11 MED ORDER — NAPROXEN 500 MG PO TABS: 500.0000 mg | ORAL_TABLET | Freq: Two times a day (BID) | ORAL | 0 refills | Status: AC

## 2020-02-11 MED ORDER — HYDROCODONE-ACETAMINOPHEN 5-325 MG PO TABS
1.0000 | ORAL_TABLET | ORAL | Status: AC
  Administered 2020-02-11: 1 via ORAL
  Filled 2020-02-11: qty 1

## 2020-02-11 MED ORDER — IOHEXOL 300 MG/ML  SOLN
100.0000 mL | Freq: Once | INTRAMUSCULAR | Status: AC | PRN
  Administered 2020-02-11: 100 mL via INTRAVENOUS

## 2020-02-11 MED ORDER — SODIUM CHLORIDE 0.9% FLUSH
3.0000 mL | Freq: Two times a day (BID) | INTRAVENOUS | Status: DC
  Administered 2020-02-11: 3 mL via INTRAVENOUS

## 2020-02-11 MED ORDER — IBUPROFEN 200 MG PO TABS
600.0000 mg | ORAL_TABLET | Freq: Once | ORAL | Status: AC
  Administered 2020-02-11: 600 mg via ORAL
  Filled 2020-02-11: qty 3

## 2020-02-11 MED ORDER — SODIUM CHLORIDE 0.9 % IV SOLN
250.0000 mL | INTRAVENOUS | Status: DC | PRN
Start: 2020-02-11 — End: 2020-02-12

## 2020-02-11 MED ORDER — HYDROCODONE-ACETAMINOPHEN 5-325 MG PO TABS: 1.0000 | ORAL_TABLET | Freq: Four times a day (QID) | ORAL | 0 refills | Status: DC | PRN

## 2020-02-11 NOTE — ED Notes (Signed)
Patient transport to CT

## 2020-02-11 NOTE — ED Notes (Signed)
Discharge instructions reviewed with an interpreter

## 2020-02-11 NOTE — Progress Notes (Signed)
Orthopedic Tech Progress Note Patient Details:  Shavanna Furnari Mar 24, 1981 [id number]  Ortho Devices Type of Ortho Device: Ace wrap, Long arm splint, Arm sling Ortho Device/Splint Location: LUE Ortho Device/Splint Interventions: Ordered, Application   Post Interventions Patient Tolerated: Well Instructions Provided: Care of device   Jennye Moccasin 02/11/2020, 5:07 PM
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
## 2020-02-11 NOTE — Discharge Instructions (Addendum)
Take the medications for pain.  Apply ice to help with swelling.  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 Aplique hielo a su ayuda para ayudar con la hinchazn. Trate de mantener su brazo [employer]

## 2020-02-11 NOTE — ED Notes (Signed)
Patient back from CT.

## 2020-03-01 ENCOUNTER — Ambulatory Visit: Payer: Self-pay

## 2020-03-01 ENCOUNTER — Encounter: Payer: Self-pay | Admitting: Orthopaedic Surgery

## 2020-03-01 ENCOUNTER — Ambulatory Visit (INDEPENDENT_AMBULATORY_CARE_PROVIDER_SITE_OTHER): Payer: Self-pay | Admitting: Orthopaedic Surgery

## 2020-03-01 DIAGNOSIS — S53105D Unspecified dislocation of left ulnohumeral joint, subsequent encounter: Secondary | ICD-10-CM

## 2020-03-01 DIAGNOSIS — M25522 Pain in left elbow: Secondary | ICD-10-CM

## 2020-03-01 MED ORDER — HYDROCODONE-ACETAMINOPHEN 5-325 MG PO TABS
1.0000 | ORAL_TABLET | Freq: Four times a day (QID) | ORAL | 0 refills | Status: AC | PRN
Start: 2020-03-01 — End: ?

## 2020-03-01 MED ORDER — DICLOFENAC SODIUM 75 MG PO TBEC: 75.0000 mg | DELAYED_RELEASE_TABLET | Freq: Two times a day (BID) | ORAL | 1 refills | Status: AC | PRN
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
## 2020-03-15 ENCOUNTER — Ambulatory Visit: Payer: Self-pay | Admitting: Orthopaedic Surgery
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
